# Patient Record
Sex: Female | Born: 1975 | Race: Asian | Hispanic: No | Marital: Single | Smoking: Never smoker
Health system: Southern US, Community
[De-identification: ages and names within clinical notes are randomized; demographics above are authoritative.]

---

## 2019-08-27 ENCOUNTER — Other Ambulatory Visit: Payer: Self-pay

## 2019-08-27 ENCOUNTER — Encounter (HOSPITAL_BASED_OUTPATIENT_CLINIC_OR_DEPARTMENT_OTHER): Payer: Self-pay

## 2019-08-27 ENCOUNTER — Emergency Department (HOSPITAL_BASED_OUTPATIENT_CLINIC_OR_DEPARTMENT_OTHER)
Admission: EM | Admit: 2019-08-27 | Discharge: 2019-08-27 | Disposition: A | Discharge status: Home / Self Care | Payer: BC Managed Care – PPO | Attending: Emergency Medicine | Admitting: Emergency Medicine

## 2019-08-27 ENCOUNTER — Emergency Department (HOSPITAL_BASED_OUTPATIENT_CLINIC_OR_DEPARTMENT_OTHER): Payer: BC Managed Care – PPO

## 2019-08-27 DIAGNOSIS — U071 COVID-19: Secondary | ICD-10-CM | POA: Diagnosis not present

## 2019-08-27 DIAGNOSIS — R509 Fever, unspecified: Secondary | ICD-10-CM | POA: Diagnosis present

## 2019-08-27 DIAGNOSIS — J1289 Other viral pneumonia: Secondary | ICD-10-CM | POA: Insufficient documentation

## 2019-08-27 DIAGNOSIS — J189 Pneumonia, unspecified organism: Secondary | ICD-10-CM

## 2019-08-27 MED ORDER — AMOXICILLIN 500 MG PO CAPS: 500.0000 mg | ORAL_CAPSULE | Freq: Three times a day (TID) | ORAL | 0 refills | Status: AC

## 2019-08-27 MED ORDER — AZITHROMYCIN 250 MG PO TABS: 250.0000 mg | ORAL_TABLET | Freq: Every day | ORAL | 0 refills | Status: AC

## 2019-08-27 MED ORDER — DEXAMETHASONE 6 MG PO TABS: 6.0000 mg | ORAL_TABLET | Freq: Two times a day (BID) | ORAL | 0 refills | Status: AC

## 2019-08-27 MED ORDER — IBUPROFEN 400 MG PO TABS
600.0000 mg | ORAL_TABLET | Freq: Once | ORAL | Status: AC
  Administered 2019-08-27: 600 mg via ORAL
  Filled 2019-08-27: qty 1

## 2019-08-27 NOTE — ED Provider Notes (Signed)
[facility] EMERGENCY DEPARTMENT Provider Note   CSN: [id number] Arrival date & time: 08/27/19  0915     History Chief Complaint  Patient presents with  . Fever    Hayley Huang is a 44 y.o. female.  HPI   44 year old female with cough, fever and shortness of breath.  Known Covid positive.  She reports that she is diagnosed positive last Wednesday.  Her symptoms preceded this by few days.  Multiple family members testing positive into hospitalized.  She continues to have fevers up to 103.  Coughing.  Some mild dyspnea with activities.  She was having some GI symptoms initially which have since resolved.  She reports that she is otherwise healthy.  History reviewed. No pertinent past medical history.  There are no problems to display for this patient.   History reviewed. No pertinent surgical history.   OB History   No obstetric history on file.     No family history on file.  Social History   Tobacco Use  . Smoking status: Never Smoker  . Smokeless tobacco: Never Used  Substance Use Topics  . Alcohol use: Never  . Drug use: Never    Home Medications Prior to Admission medications   Not on File    Allergies    Patient has no known allergies.  Review of Systems   Review of Systems All systems reviewed and negative, other than as noted in HPI.  Physical Exam Updated Vital Signs BP 110/79 (BP Location: Right Arm)   Pulse (!) 116   Temp (!) 100.7 F (38.2 C) (Oral)   Resp 20   Ht 5' (1.524 m)   Wt 58.1 kg   LMP 08/12/2019   SpO2 97%   BMI 25.00 kg/m   Physical Exam Vitals and nursing note reviewed.  Constitutional:      General: She is not in acute distress.    Appearance: She is well-developed.  HENT:     Head: Normocephalic and atraumatic.  Eyes:     General:        Right eye: No discharge.        Left eye: No discharge.     Conjunctiva/sclera: Conjunctivae normal.  Cardiovascular:     Rate and Rhythm: Regular rhythm.  Tachycardia present.     Heart sounds: Normal heart sounds. No murmur. No friction rub. No gallop.   Pulmonary:     Effort: Pulmonary effort is normal. No respiratory distress.     Breath sounds: Rhonchi present.     Comments: LLL rhonchi. Occasional coughing.  Abdominal:     General: There is no distension.     Palpations: Abdomen is soft.     Tenderness: There is no abdominal tenderness.  Musculoskeletal:        General: No tenderness.     Cervical back: Neck supple.     Comments: Lower extremities symmetric as compared to each other. No calf tenderness. Negative Homan's. No palpable cords.   Skin:    General: Skin is warm and dry.  Neurological:     Mental Status: She is alert.  Psychiatric:        Behavior: Behavior normal.        Thought Content: Thought content normal.     ED Results / Procedures / Treatments   Labs (all labs ordered are listed, but only abnormal results are displayed) Labs Reviewed - No data to display  EKG None  Radiology DG Chest Portable 1 View  Result Date:  08/27/2019 CLINICAL DATA:  COVID-19 positive. Cough and fever. Shortness of breath. EXAM: PORTABLE CHEST 1 VIEW COMPARISON:  None. FINDINGS: There is a focal area of increased opacity in the left base consistent with pneumonia. Lungs elsewhere clear. Heart size and pulmonary vascularity are normal. No adenopathy. No bone lesions. IMPRESSION: Small focus of apparent pneumonia left base. Lungs elsewhere clear. Cardiac silhouette normal. No adenopathy. Electronically Signed   By: Lowella Grip III M.D.   On: 08/27/2019 10:09    Procedures Procedures (including critical care time)  Medications Ordered in ED Medications  ibuprofen (ADVIL) tablet 600 mg (has no administration in time range)    ED Course  I have reviewed the triage vital signs and the nursing notes.  Pertinent labs & imaging results that were available during my care of the patient were reviewed by me and considered in my  medical decision making (see chart for details).    MDM Rules/Calculators/A&P                      44 year old female with reported recent COVID+ with cough and fever. Probably COVID. Focal LLL rhonchi though with corresponding imaging findings. Will cover for possible CAP. Fine for outpt tx at this point. Return precautions discussed.   Hayley Huang was evaluated in Emergency Department on 08/27/2019 for the symptoms described in the history of present illness. She was evaluated in the context of the global COVID-19 pandemic, which necessitated consideration that the patient might be at risk for infection with the SARS-CoV-2 virus that causes COVID-19. Institutional protocols and algorithms that pertain to the evaluation of patients at risk for COVID-19 are in a state of rapid change based on information released by regulatory bodies including the CDC and federal and state organizations. These policies and algorithms were followed during the patient's care in the ED.   Final Clinical Impression(s) / ED Diagnoses Final diagnoses:  COVID-19 virus infection  Community acquired pneumonia of left lower lobe of lung    Rx / DC Orders ED Discharge Orders    None       Virgel Manifold, MD 08/27/19 1246

## 2019-08-27 NOTE — ED Triage Notes (Signed)
Pt arrives to ED POV with reports of recent Covid diagnosis last Wednesday pt reports concern for continued fevers at home. Temp in triage 100.7, highest recorded at home was 103.7 yesterday. Pt has not had any medication today.

## 2021-01-31 IMAGING — DX DG CHEST 1V PORT
1 series · 1 of 1 positions shown · non-contrast
Comparison: None.

CLINICAL DATA: ABIM2-ED positive. Cough and fever. Shortness of
breath.

EXAM:
PORTABLE CHEST 1 VIEW

[chest ap]
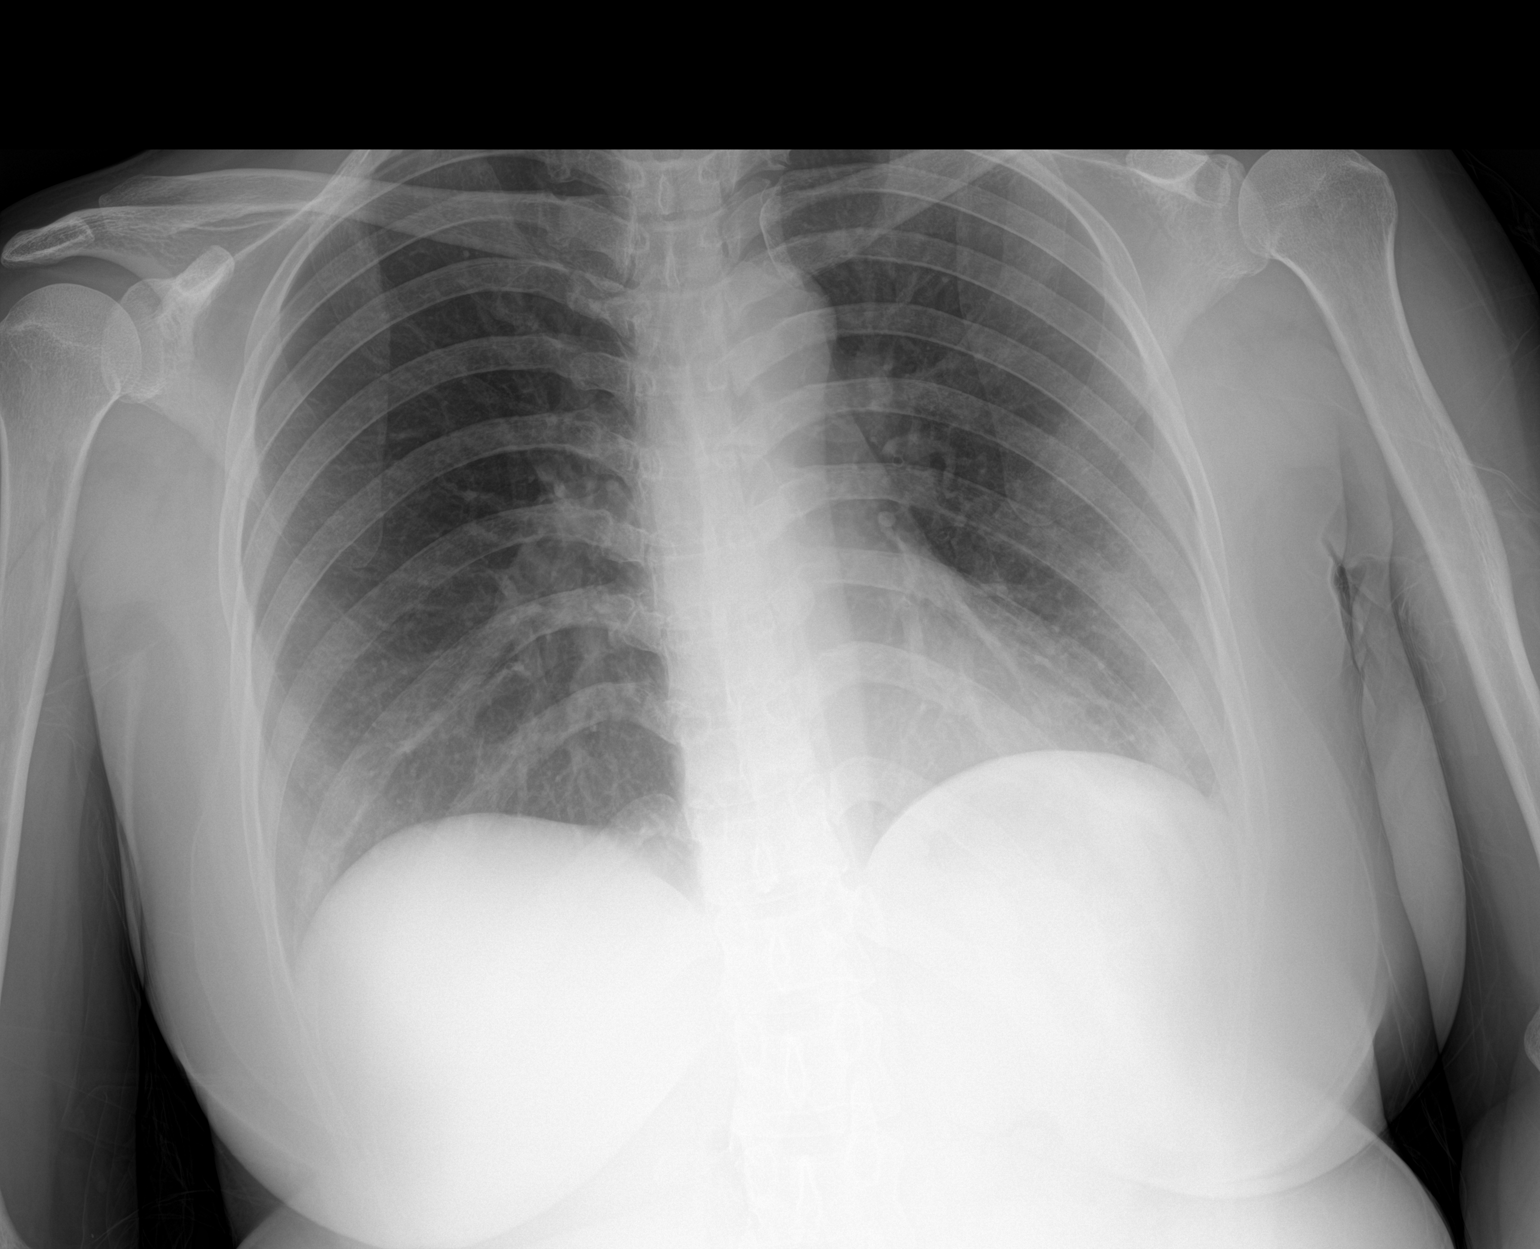

[1 of 1 positions shown; findings below may reference images not displayed]

FINDINGS: There is a focal area of increased opacity in the left base
consistent with pneumonia. Lungs elsewhere clear. Heart size and
pulmonary vascularity are normal. No adenopathy. No bone lesions.
IMPRESSION: Small focus of apparent pneumonia left base. Lungs elsewhere clear.
Cardiac silhouette normal. No adenopathy.

## 2021-09-22 ENCOUNTER — Emergency Department (HOSPITAL_BASED_OUTPATIENT_CLINIC_OR_DEPARTMENT_OTHER): Payer: BC Managed Care – PPO

## 2021-09-22 ENCOUNTER — Encounter (HOSPITAL_BASED_OUTPATIENT_CLINIC_OR_DEPARTMENT_OTHER): Payer: Self-pay | Admitting: Emergency Medicine

## 2021-09-22 ENCOUNTER — Other Ambulatory Visit: Payer: Self-pay

## 2021-09-22 ENCOUNTER — Emergency Department (HOSPITAL_BASED_OUTPATIENT_CLINIC_OR_DEPARTMENT_OTHER)
Admission: EM | Admit: 2021-09-22 | Discharge: 2021-09-22 | Disposition: A | Discharge status: Home / Self Care | Payer: BC Managed Care – PPO | Attending: Emergency Medicine | Admitting: Emergency Medicine

## 2021-09-22 DIAGNOSIS — M79641 Pain in right hand: Secondary | ICD-10-CM | POA: Insufficient documentation

## 2021-09-22 MED ORDER — IBUPROFEN 800 MG PO TABS: 800.0000 mg | ORAL_TABLET | Freq: Three times a day (TID) | ORAL | 0 refills | Status: AC | PRN

## 2021-09-22 MED ORDER — CEPHALEXIN 500 MG PO CAPS: 500.0000 mg | ORAL_CAPSULE | Freq: Four times a day (QID) | ORAL | 0 refills | Status: AC

## 2021-09-22 NOTE — ED Provider Notes (Signed)
[facility] EMERGENCY DEPARTMENT Provider Note   CSN: [id number] Arrival date & time: 09/22/21  0825     History  Chief Complaint  Patient presents with   Hand Pain    Hayley Huang is a 46 y.o. female.  She has no significant past medical history.  Complaining of pain in her right base of fifth finger that started last evening.  No known trauma. No numbness or weakness.  Pain worse with palpation.  No prior history of same.  The history is provided by the patient.  Hand Pain This is a new problem. The current episode started yesterday. The problem occurs constantly. The problem has not changed since onset.Pertinent negatives include no chest pain, no abdominal pain, no headaches and no shortness of breath. The symptoms are aggravated by bending. Nothing relieves the symptoms. She has tried rest for the symptoms. The treatment provided no relief.      Home Medications Prior to Admission medications   Medication Sig Start Date End Date Taking? Authorizing Provider  amoxicillin (AMOXIL) 500 MG capsule Take 1 capsule (500 mg total) by mouth 3 (three) times daily. 08/27/19   Virgel Manifold, MD  azithromycin (ZITHROMAX) 250 MG tablet Take 1 tablet (250 mg total) by mouth daily. Take first 2 tablets together, then 1 every day until finished. 08/27/19   Virgel Manifold, MD  dexamethasone (DECADRON) 6 MG tablet Take 1 tablet (6 mg total) by mouth 2 (two) times daily with a meal. 08/27/19   Virgel Manifold, MD      Allergies    Patient has no known allergies.    Review of Systems   Review of Systems  Constitutional:  Negative for fever.  Respiratory:  Negative for shortness of breath.   Cardiovascular:  Negative for chest pain.  Gastrointestinal:  Negative for abdominal pain.  Skin:  Negative for wound.  Neurological:  Negative for weakness, numbness and headaches.   Physical Exam Updated Vital Signs BP 98/74    Temp 98.8 F (37.1 C) (Oral)    Resp 18  Physical  Exam Constitutional:      Appearance: Normal appearance. She is well-developed.  HENT:     Head: Normocephalic and atraumatic.  Eyes:     Conjunctiva/sclera: Conjunctivae normal.  Musculoskeletal:        General: Swelling and tenderness present. No deformity. Normal range of motion.     Cervical back: Neck supple.     Comments: She has pain in her right fifth finger at the metacarpal head.  There is a little bit of overlying swelling and redness.  Full range of motion.  FDS FDC and EDC intact.  Cap refill brisk.  Skin:    General: Skin is warm and dry.  Neurological:     General: No focal deficit present.     Mental Status: She is alert.     GCS: GCS eye subscore is 4. GCS verbal subscore is 5. GCS motor subscore is 6.     Sensory: No sensory deficit.     Motor: No weakness.    ED Results / Procedures / Treatments   Labs (all labs ordered are listed, but only abnormal results are displayed) Labs Reviewed - No data to display  EKG None  Radiology DG Hand Complete Right  Result Date: 09/22/2021 CLINICAL DATA:  Fifth metacarpal region pain. Possible bug bite last night. EXAM: RIGHT HAND - COMPLETE 3+ VIEW COMPARISON:  None. FINDINGS: No fracture or bone lesion. Joints are normally spaced and  aligned. Soft tissues are unremarkable. IMPRESSION: Negative. Electronically Signed   By: Lajean Manes M.D.   On: 09/22/2021 10:04    Procedures Procedures    Medications Ordered in ED Medications - No data to display  ED Course/ Medical Decision Making/ A&P Clinical Course as of 09/22/21 1741  Sat Sep 22, 2021  0938 X-ray ordered and interpreted by me as no acute fracture or dislocation.  Awaiting radiology reading [MB]    Clinical Course User Index [MB] Hayden Rasmussen, MD                           Medical Decision Making Amount and/or Complexity of Data Reviewed Radiology: ordered.  Risk Prescription drug management.  46 year old female here with complaint of atraumatic  right hand pain.  She does a lot of typing at work.  No known trauma.  She is some tenderness and swelling at her fifth metacarpal head.  No obvious foreign body or wound.  X-rays do not show any acute fracture or dislocation.  Will treat with NSAIDs and antibiotics for possible infection.  Question possible inflammatory reaction.  Return instructions discussed.  Contact information for hand follow-up given.         Final Clinical Impression(s) / ED Diagnoses Final diagnoses:  Right hand pain    Rx / DC Orders ED Discharge Orders          Ordered    cephALEXin (KEFLEX) 500 MG capsule  4 times daily        09/22/21 0939    ibuprofen (ADVIL) 800 MG tablet  Every 8 hours PRN        09/22/21 0939              Hayden Rasmussen, MD 09/22/21 1742

## 2021-09-22 NOTE — Discharge Instructions (Signed)
You were seen in the emergency department for pain in your right hand.  Your x-ray did not show any obvious fracture dislocation or foreign body.  We are treating with antibiotics for possible infection and anti-inflammatory medication.  You should use ice to the affected area.  Follow-up with hand surgery if any continued symptoms.

## 2021-09-22 NOTE — ED Triage Notes (Signed)
Pt reports she began to have R anterior hand pain, swelling, and tingling just below her pinky finger. No known injury. Pain worse with movement.
